# Patient Record
Sex: Male | Born: 2000 | Race: White | Hispanic: No | Marital: Single | Smoking: Former smoker
Health system: Southern US, Community
[De-identification: ages and names within clinical notes are randomized; demographics above are authoritative.]

## PROBLEM LIST (undated history)

## (undated) HISTORY — PX: TONSILLECTOMY: SUR1361

---
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
## 2021-05-19 ENCOUNTER — Other Ambulatory Visit: Payer: Self-pay

## 2021-05-19 ENCOUNTER — Ambulatory Visit: Payer: Self-pay

## 2021-05-19 ENCOUNTER — Encounter: Payer: Self-pay | Admitting: Emergency Medicine

## 2021-05-19 ENCOUNTER — Ambulatory Visit
Admission: EM | Admit: 2021-05-19 | Discharge: 2021-05-19 | Disposition: A | Discharge status: Home / Self Care | Payer: BLUE CROSS/BLUE SHIELD | Attending: Internal Medicine | Admitting: Internal Medicine

## 2021-05-19 DIAGNOSIS — R058 Other specified cough: Secondary | ICD-10-CM | POA: Diagnosis present

## 2021-05-19 DIAGNOSIS — R0789 Other chest pain: Secondary | ICD-10-CM | POA: Diagnosis present

## 2021-05-19 LAB — CBC WITH DIFFERENTIAL/PLATELET
Abs Immature Granulocytes: 0.03 10*3/uL (ref 0.00–0.07)
Basophils Absolute: 0 10*3/uL (ref 0.0–0.1)
Basophils Relative: 1 %
Eosinophils Absolute: 0.1 10*3/uL (ref 0.0–0.5)
Eosinophils Relative: 2 %
HCT: 49.4 % (ref 39.0–52.0)
Hemoglobin: 16.7 g/dL (ref 13.0–17.0)
Immature Granulocytes: 1 %
Lymphocytes Relative: 38 %
Lymphs Abs: 1.5 10*3/uL (ref 0.7–4.0)
MCH: 27.7 pg (ref 26.0–34.0)
MCHC: 33.8 g/dL (ref 30.0–36.0)
MCV: 82.1 fL (ref 80.0–100.0)
Monocytes Absolute: 0.4 10*3/uL (ref 0.1–1.0)
Monocytes Relative: 10 %
Neutro Abs: 1.9 10*3/uL (ref 1.7–7.7)
Neutrophils Relative %: 48 %
Platelets: 277 10*3/uL (ref 150–400)
RBC: 6.02 MIL/uL — ABNORMAL HIGH (ref 4.22–5.81)
RDW: 12.9 % (ref 11.5–15.5)
WBC: 3.9 10*3/uL — ABNORMAL LOW (ref 4.0–10.5)
nRBC: 0 % (ref 0.0–0.2)

## 2021-05-19 LAB — COMPREHENSIVE METABOLIC PANEL
ALT: 45 U/L — ABNORMAL HIGH (ref 0–44)
AST: 24 U/L (ref 15–41)
Albumin: 5.5 g/dL — ABNORMAL HIGH (ref 3.5–5.0)
Alkaline Phosphatase: 54 U/L (ref 38–126)
Anion gap: 9 (ref 5–15)
BUN: 12 mg/dL (ref 6–20)
CO2: 28 mmol/L (ref 22–32)
Calcium: 9.5 mg/dL (ref 8.9–10.3)
Chloride: 102 mmol/L (ref 98–111)
Creatinine, Ser: 0.92 mg/dL (ref 0.61–1.24)
GFR, Estimated: >60 mL/min (ref >60)
Glucose, Bld: 99 mg/dL (ref 70–99)
Potassium: 4.2 mmol/L (ref 3.5–5.1)
Sodium: 139 mmol/L (ref 135–145)
Total Bilirubin: 1.3 mg/dL — ABNORMAL HIGH (ref 0.3–1.2)
Total Protein: 8.4 g/dL — ABNORMAL HIGH (ref 6.5–8.1)

## 2021-05-19 NOTE — Discharge Instructions (Signed)
Your physical exam is normal. We will call you with recommendations if labs are abnormal.

## 2021-05-19 NOTE — ED Triage Notes (Signed)
Patient c/o sore throat, cough, and chest discomfort that started 2 months ago.  Patient reports chills.  Patient denies fevers.

## 2021-05-22 NOTE — ED Provider Notes (Signed)
[facility]    CSN: [id number] Arrival date & time: 05/19/21  1130      History   Chief Complaint Chief Complaint  Patient presents with   Cough   Sore Throat    HPI Zayon Trulson is a 20 y.o. male comes to the urgent care with 6-month story of intermittent sore throat, cough and chest discomfort.  Patient endorses chills.  Patient's symptoms started insidiously and has been persistent.  He denies any fevers, night sweats or weight loss.  No shortness of breath or wheezing.  No sick contacts.  No chest pain or chest pressure.  No dizziness, near syncope or syncopal episodes.Marland Kitchen   HPI  History reviewed. No pertinent past medical history.  There are no problems to display for this patient.   Past Surgical History:  Procedure Laterality Date   TONSILLECTOMY         Home Medications    Prior to Admission medications   Not on File    Family History History reviewed. No pertinent family history.  Social History Social History   Tobacco Use   Smoking status: Former    Types: Cigarettes   Smokeless tobacco: Never  Vaping Use   Vaping Use: Former  Substance Use Topics   Alcohol use: Not Currently   Drug use: Never     Allergies   Patient has no known allergies.   Review of Systems Review of Systems  Respiratory:  Positive for cough, chest tightness, shortness of breath and wheezing. Negative for choking.   Gastrointestinal: Negative.   Musculoskeletal: Negative.   Neurological: Negative.     Physical Exam Triage Vital Signs ED Triage Vitals  Enc Vitals Group     BP 05/19/21 1206 (!) 141/94     Pulse Rate 05/19/21 1206 (!) 104     Resp 05/19/21 1206 16     Temp 05/19/21 1206 98.4 F (36.9 C)     Temp Source 05/19/21 1206 Oral     SpO2 05/19/21 1206 99 %     Weight 05/19/21 1203 170 lb (77.1 kg)     Height 05/19/21 1203 6\' 1"  (1.854 m)     Head Circumference --      Peak Flow --      Pain Score 05/19/21 1203 4     Pain Loc --       Pain Edu? --      Excl. in GC? --    No data found.  Updated Vital Signs BP (!) 141/94 (BP Location: Left Arm)   Pulse (!) 104   Temp 98.4 F (36.9 C) (Oral)   Resp 16   Ht 6\' 1"  (1.854 m)   Wt 77.1 kg   SpO2 99%   BMI 22.43 kg/m   Visual Acuity Right Eye Distance:   Left Eye Distance:   Bilateral Distance:    Right Eye Near:   Left Eye Near:    Bilateral Near:     Physical Exam Vitals and nursing note reviewed.  Constitutional:      General: He is not in acute distress.    Appearance: He is not ill-appearing.  HENT:     Right Ear: Tympanic membrane normal.     Left Ear: Tympanic membrane normal.     Mouth/Throat:     Tonsils: No tonsillar exudate. 0 on the right. 0 on the left.  Pulmonary:     Effort: Pulmonary effort is normal.     Breath sounds: Normal breath sounds.  Abdominal:     Palpations: Abdomen is soft.  Musculoskeletal:     Cervical back: Normal range of motion.  Neurological:     Mental Status: He is alert.     UC Treatments / Results  Labs (all labs ordered are listed, but only abnormal results are displayed) Labs Reviewed  CBC WITH DIFFERENTIAL/PLATELET - Abnormal; Notable for the following components:      Result Value   WBC 3.9 (*)    RBC 6.02 (*)    All other components within normal limits  COMPREHENSIVE METABOLIC PANEL - Abnormal; Notable for the following components:   Total Protein 8.4 (*)    Albumin 5.5 (*)    ALT 45 (*)    Total Bilirubin 1.3 (*)    All other components within normal limits    EKG   Radiology No results found.  Procedures Procedures (including critical care time)  Medications Ordered in UC Medications - No data to display  Initial Impression / Assessment and Plan / UC Course  I have reviewed the triage vital signs and the nursing notes.  Pertinent labs & imaging results that were available during my care of the patient were reviewed by me and considered in my medical decision making (see  chart for details).     1.  Cough most likely postviral cough: Over-the-counter medications Maintain hydration CBC is remarkable for WBC of 3.6 CMP is remarkable for slightly elevated ALT and bilirubin. Return to urgent care if symptoms worsen. Final Clinical Impressions(s) / UC Diagnoses   Final diagnoses:  Other cough  Chest discomfort     Discharge Instructions      Your physical exam is normal. We will call you with recommendations if labs are abnormal.   ED Prescriptions   None    PDMP not reviewed this encounter.   Merrilee Jansky, MD 05/22/21 [phone]

## 2021-06-29 ENCOUNTER — Ambulatory Visit: Payer: Self-pay | Admitting: Internal Medicine
